# Patient Record
Sex: Male | Born: 1985 | Race: White | Hispanic: No | Marital: Single | Smoking: Former smoker
Health system: Southern US, Community
[De-identification: ages and names within clinical notes are randomized; demographics above are authoritative.]

## PROBLEM LIST (undated history)

## (undated) DIAGNOSIS — T7840XA Allergy, unspecified, initial encounter: Secondary | ICD-10-CM

## (undated) HISTORY — DX: Allergy, unspecified, initial encounter: T78.40XA

## (undated) HISTORY — PX: OTHER SURGICAL HISTORY: SHX169

---

## 2017-02-23 ENCOUNTER — Other Ambulatory Visit: Payer: Self-pay

## 2017-02-23 ENCOUNTER — Emergency Department
Admission: EM | Admit: 2017-02-23 | Discharge: 2017-02-23 | Disposition: A | Discharge status: Home / Self Care | Payer: No Typology Code available for payment source | Attending: Emergency Medicine | Admitting: Emergency Medicine

## 2017-02-23 ENCOUNTER — Emergency Department: Payer: No Typology Code available for payment source

## 2017-02-23 ENCOUNTER — Encounter: Payer: Self-pay | Admitting: *Deleted

## 2017-02-23 DIAGNOSIS — M542 Cervicalgia: Secondary | ICD-10-CM | POA: Insufficient documentation

## 2017-02-23 DIAGNOSIS — M549 Dorsalgia, unspecified: Secondary | ICD-10-CM | POA: Diagnosis not present

## 2017-02-23 DIAGNOSIS — R55 Syncope and collapse: Secondary | ICD-10-CM | POA: Diagnosis not present

## 2017-02-23 MED ORDER — MELOXICAM 15 MG PO TABS: 15.0000 mg | ORAL_TABLET | Freq: Every day | ORAL | 1 refills | Status: AC

## 2017-02-23 MED ORDER — CYCLOBENZAPRINE HCL 10 MG PO TABS: 10.0000 mg | ORAL_TABLET | Freq: Three times a day (TID) | ORAL | 0 refills | Status: AC | PRN

## 2017-02-23 NOTE — ED Notes (Signed)
Patient transported to CT 
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
## 2017-04-13 ENCOUNTER — Encounter: Payer: Self-pay | Admitting: Emergency Medicine

## 2017-04-13 ENCOUNTER — Emergency Department: Payer: BLUE CROSS/BLUE SHIELD

## 2017-04-13 ENCOUNTER — Other Ambulatory Visit: Payer: Self-pay

## 2017-04-13 ENCOUNTER — Emergency Department
Admission: EM | Admit: 2017-04-13 | Discharge: 2017-04-13 | Disposition: A | Discharge status: Home / Self Care | Payer: BLUE CROSS/BLUE SHIELD | Attending: Emergency Medicine | Admitting: Emergency Medicine

## 2017-04-13 DIAGNOSIS — S299XXA Unspecified injury of thorax, initial encounter: Secondary | ICD-10-CM | POA: Diagnosis present

## 2017-04-13 DIAGNOSIS — Y999 Unspecified external cause status: Secondary | ICD-10-CM | POA: Diagnosis not present

## 2017-04-13 DIAGNOSIS — X500XXA Overexertion from strenuous movement or load, initial encounter: Secondary | ICD-10-CM | POA: Insufficient documentation

## 2017-04-13 DIAGNOSIS — Y929 Unspecified place or not applicable: Secondary | ICD-10-CM | POA: Diagnosis not present

## 2017-04-13 DIAGNOSIS — F17290 Nicotine dependence, other tobacco product, uncomplicated: Secondary | ICD-10-CM | POA: Insufficient documentation

## 2017-04-13 DIAGNOSIS — Y939 Activity, unspecified: Secondary | ICD-10-CM | POA: Insufficient documentation

## 2017-04-13 DIAGNOSIS — S29011A Strain of muscle and tendon of front wall of thorax, initial encounter: Secondary | ICD-10-CM | POA: Insufficient documentation

## 2017-04-13 LAB — COMPREHENSIVE METABOLIC PANEL
ALBUMIN: 4.6 g/dL (ref 3.5–5.0)
ALK PHOS: 78 U/L (ref 38–126)
ALT: 45 U/L (ref 17–63)
ANION GAP: 9 (ref 5–15)
AST: 31 U/L (ref 15–41)
BILIRUBIN TOTAL: 0.8 mg/dL (ref 0.3–1.2)
BUN: 10 mg/dL (ref 6–20)
CALCIUM: 9.4 mg/dL (ref 8.9–10.3)
CO2: 23 mmol/L (ref 22–32)
Chloride: 106 mmol/L (ref 101–111)
Creatinine, Ser: 1 mg/dL (ref 0.61–1.24)
GFR calc Af Amer: >60 mL/min (ref >60)
GFR calc non Af Amer: >60 mL/min (ref >60)
GLUCOSE: 104 mg/dL — AB (ref 65–99)
Potassium: 4.2 mmol/L (ref 3.5–5.1)
Sodium: 138 mmol/L (ref 135–145)
TOTAL PROTEIN: 7.5 g/dL (ref 6.5–8.1)

## 2017-04-13 LAB — URINALYSIS, COMPLETE (UACMP) WITH MICROSCOPIC
Bacteria, UA: NONE SEEN
Bilirubin Urine: NEGATIVE
GLUCOSE, UA: NEGATIVE mg/dL
HGB URINE DIPSTICK: NEGATIVE
Ketones, ur: NEGATIVE mg/dL
Leukocytes, UA: NEGATIVE
NITRITE: NEGATIVE
PH: 6 (ref 5.0–8.0)
Protein, ur: NEGATIVE mg/dL
Specific Gravity, Urine: 1.006 (ref 1.005–1.030)
Squamous Epithelial / LPF: NONE SEEN

## 2017-04-13 LAB — CBC
HCT: 47.8 % (ref 40.0–52.0)
HEMOGLOBIN: 16.5 g/dL (ref 13.0–18.0)
MCH: 32.1 pg (ref 26.0–34.0)
MCHC: 34.4 g/dL (ref 32.0–36.0)
MCV: 93.2 fL (ref 80.0–100.0)
Platelets: 187 10*3/uL (ref 150–440)
RBC: 5.13 MIL/uL (ref 4.40–5.90)
RDW: 13.7 % (ref 11.5–14.5)
WBC: 10.6 10*3/uL (ref 3.8–10.6)

## 2017-04-13 LAB — LIPASE, BLOOD: Lipase: 25 U/L (ref 11–51)

## 2017-04-13 MED ORDER — IBUPROFEN 800 MG PO TABS
800.0000 mg | ORAL_TABLET | ORAL | Status: AC
  Administered 2017-04-13: 800 mg via ORAL
  Filled 2017-04-13: qty 1

## 2017-04-13 NOTE — ED Notes (Signed)
To x-ray and returned, c/o pain 5/10 when at rest

## 2017-04-13 NOTE — Discharge Instructions (Signed)
Return to the Emergency Department (ED) if you experience any further chest pain/pressure/tightness, difficulty breathing, or sudden sweating, or other symptoms that concern you. ° °

## 2017-04-13 NOTE — ED Provider Notes (Signed)
[facility] Emergency Department Provider Note   ____________________________________________   None    (approximate)  I have reviewed the triage vital signs and the nursing notes.   HISTORY  Chief Complaint Right lower rib pain     HPI Greg Murray is a 32 y.o. male here for evaluation of right lower chest pain.  Patient began experiencing pain in his right lower chest today.  Patient reports that he was working and he coughed.  When he coughed he had the relatively sudden and sharp pain over the right lower ribs.  No shortness of breath.  Reports when he coughs now or takes a deep breath he feels pain over the right lower ribs.  Denies abdominal pain.  No nausea vomiting.  No fevers or chills.  Somewhat unclear as the triage report reports right lower abdominal pain, the patient consistently explains to me that his pain is in the right lower chest wall with coughing and he has no pain in his abdomen.  No pain in the back.  No radiation.  No left-sided chest pain.  Pain is very intermittent, only mostly present when he takes a slight cough.  No productive cough fever or chills.  Reports he thinks he "strained a rib".  No history of any blood clots.  No long trips or travels.  Takes no medication at home.  Is a smoker.  No leg swelling.   History reviewed. No pertinent past medical history.  There are no active problems to display for this patient.   History reviewed. No pertinent surgical history.  Prior to Admission medications   Not on File    Allergies Patient has no known allergies.  History reviewed. No pertinent family history.  Social History Social History   Tobacco Use  . Smoking status: Never Smoker  . Smokeless tobacco: Current User    Types: Snuff  Substance Use Topics  . Alcohol use: Yes    Comment: occasionally  . Drug use: No    Review of Systems Constitutional: No fever/chills Eyes: No visual changes. ENT: No  sore throat. Cardiovascular: See HPI  respiratory: Denies shortness of breath. Gastrointestinal: No abdominal pain.  No nausea, no vomiting.  No diarrhea.  No constipation.  Patient reports he never had any nausea, unclear with documented triage.  He reports not feeling nauseated all. Genitourinary: Negative for dysuria. Musculoskeletal: Negative for back pain. Skin: Negative for rash. Neurological: Negative for headaches, focal weakness or numbness.    ____________________________________________   PHYSICAL EXAM:  VITAL SIGNS: ED Triage Vitals  Enc Vitals Group     BP 04/13/17 1056 (!) 145/92     Pulse Rate 04/13/17 1056 84     Resp 04/13/17 1056 20     Temp 04/13/17 1056 97.9 F (36.6 C)     Temp Source 04/13/17 1056 Oral     SpO2 04/13/17 1056 96 %     Weight 04/13/17 1057 260 lb (117.9 kg)     Height --      Head Circumference --      Peak Flow --      Pain Score 04/13/17 1057 5     Pain Loc --      Pain Edu? --      Excl. in GC? --     Constitutional: Alert and oriented. Well appearing and in no acute distress. Eyes: Conjunctivae are normal. Head: Atraumatic. Nose: No congestion/rhinnorhea. Mouth/Throat: Mucous membranes are moist. Neck: No stridor.   Cardiovascular:  Normal rate, regular rhythm. Grossly normal heart sounds.  Good peripheral circulation. Respiratory: Normal respiratory effort.  No retractions. Lungs CTAB.  Patient has reproducible pain to palpation over the right lateral chest wall, focally between approximately the 10th and 11th rib space.  Reports pain is worse with any bends to the side, takes a deep breath.  No crepitus.  No edema or overlying lesion. Gastrointestinal: Soft and nontender. No distention. Musculoskeletal: No lower extremity tenderness nor edema. Neurologic:  Normal speech and language. No gross focal neurologic deficits are appreciated.  Skin:  Skin is warm, dry and intact. No rash noted. Psychiatric: Mood and affect are normal.  Speech and behavior are normal.  ____________________________________________   LABS (all labs ordered are listed, but only abnormal results are displayed)  Labs Reviewed  COMPREHENSIVE METABOLIC PANEL - Abnormal; Notable for the following components:      Result Value   Glucose, Bld 104 (*)    All other components within normal limits  URINALYSIS, COMPLETE (UACMP) WITH MICROSCOPIC - Abnormal; Notable for the following components:   Color, Urine STRAW (*)    APPearance CLEAR (*)    All other components within normal limits  LIPASE, BLOOD  CBC   ____________________________________________  EKG   ____________________________________________  RADIOLOGY   Chest x-ray reviewed, negative for acute.  Minimal atelectasis.   ____________________________________________   PROCEDURES  Procedure(s) performed: None  Procedures  Critical Care performed: No  ____________________________________________   INITIAL IMPRESSION / ASSESSMENT AND PLAN / ED COURSE  Pertinent labs & imaging results that were available during my care of the patient were reviewed by me and considered in my medical decision making (see chart for details).  Patient returns for evaluation of chest pain.  Right lower associated suddenly with coughing.  Very reproducible and point tender along the lateral rib cage.  Appears consistent with muscular skeletal injury.  Chest x-ray negative for pneumothorax.  No associated cardiac symptoms.  Denies abdominal symptoms.  No right upper quadrant pain.  No pain to McBurney's point, negative Murphy.  Very reassuring clinical examination with stable hemodynamics.        Pulmonary Embolism Rule-out Criteria (PERC rule)                        If YES to ANY of the following, the PERC rule is not satisfied and cannot be used to rule out PE in this patient (consider d-dimer or imaging depending on pre-test probability).                      If NO to ALL of the  following, AND the clinician's pre-test probability is <15%, the [facility] rule is satisfied and there is no need for further workup (including no need to obtain a d-dimer) as the post-test probability of pulmonary embolism is <2%.                      Mnemonic is HAD CLOTS   H - hormone use (exogenous estrogen)      No. A - age > 50                                                 No. D - DVT/PE history  No.   C - coughing blood (hemoptysis)                 No. L - leg swelling, unilateral                             No. O - O2 Sat on Room Air < 95%                  No. T - tachycardia (HR ? 100)                         No. S - surgery or trauma, recent                      No.   Based on my evaluation of the patient, including application of this decision instrument, further testing to evaluate for pulmonary embolism is not indicated at this time. I have discussed this recommendation with the patient who states understanding and agreement with this plan.  Return precautions and treatment recommendations and follow-up discussed with the patient who is agreeable with the plan.  ____________________________________________   FINAL CLINICAL IMPRESSION(S) / ED DIAGNOSES  Final diagnoses:  Chest wall muscle strain, initial encounter      NEW MEDICATIONS STARTED DURING THIS VISIT:  New Prescriptions   No medications on file     Note:  This document was prepared using Dragon voice recognition software and may include unintentional dictation errors.     Sharyn Creamer, MD 04/13/17 1329

## 2017-04-13 NOTE — ED Triage Notes (Addendum)
Pt to ed with c/o right lower abd pain that started this am. +nausea, denies vomiting, denies diarrhea.  States he was coughing and felt a pop in right rib area.  Pt states pain is worse with cough, deep breath.

## 2017-05-13 ENCOUNTER — Other Ambulatory Visit: Payer: Self-pay

## 2017-05-13 ENCOUNTER — Encounter: Payer: Self-pay | Admitting: Family Medicine

## 2017-05-13 ENCOUNTER — Ambulatory Visit (INDEPENDENT_AMBULATORY_CARE_PROVIDER_SITE_OTHER): Payer: BLUE CROSS/BLUE SHIELD | Admitting: Family Medicine

## 2017-05-13 VITALS — BP 134/88 | HR 80 | Temp 98.2°F | Resp 18 | Ht 73.0 in | Wt 283.0 lb

## 2017-05-13 DIAGNOSIS — J301 Allergic rhinitis due to pollen: Secondary | ICD-10-CM

## 2017-05-13 DIAGNOSIS — Z7689 Persons encountering health services in other specified circumstances: Secondary | ICD-10-CM | POA: Diagnosis not present

## 2017-05-13 DIAGNOSIS — R490 Dysphonia: Secondary | ICD-10-CM

## 2017-05-13 DIAGNOSIS — J410 Simple chronic bronchitis: Secondary | ICD-10-CM | POA: Diagnosis not present

## 2017-05-13 MED ORDER — LORATADINE 10 MG PO CAPS: 1.0000 | ORAL_CAPSULE | Freq: Every day | ORAL | 11 refills | Status: DC

## 2017-05-13 MED ORDER — AZITHROMYCIN 250 MG PO TABS: ORAL_TABLET | ORAL | 0 refills | Status: DC

## 2017-05-13 MED ORDER — FLUTICASONE PROPIONATE 50 MCG/ACT NA SUSP: 2.0000 | Freq: Every day | NASAL | 11 refills | Status: DC

## 2017-05-13 NOTE — Progress Notes (Signed)
Greg Murray  MRN: [id number] DOB: 1985/03/07  Subjective:  HPI   The patient is a 32 year old male who presents to establish care and get treated for his sore throat.   The patient states that 2 months ago he started having head congestion, PND, cough and chest congestion.  His cough was productive of white phlegm.  He was using Mucinex DM and was seen by [facility] at the end of January.  He was treated with Amoxicillin and started to improve.   About a week after he finished the antibiotic he started having some low grade fever.  He was then seen at [facility] and told he had the flu and was given a pill for cough but told he could not treat him for the flu because his fever was not high enough. After a while his fever subsided but he is sstill left with cough, congestion in his throat and voice change.  He state the cone symptoms are a little better on the weekend but when he goes back to work it gets worse again.  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   There are no active problems to display for this patient.   Past Medical History:  Diagnosis Date  . Allergy     Social History   Socioeconomic History  . Marital status: Single    Spouse name: Not on file  . Number of children: Not on file  . Years of education: Not on file  . Highest education level: Not on file  Occupational History  . Occupation: heavy Clinical cytogeneticist  Social Needs  . Financial resource strain: Not on file  . Food insecurity:    Worry: Not on file    Inability: Not on file  . Transportation needs:    Medical: Not on file    Non-medical: Not on file  Tobacco Use  . Smoking status: Current Every Day Smoker    Types: Cigarettes  . Smokeless tobacco: Current User    Types: Snuff  Substance and Sexual Activity  . Alcohol use: Yes    Alcohol/week: 14.4 oz    Types: 24 Cans of beer per week    Comment: 24 cans per week  . Drug use: No  . Sexual  activity: Not on file  Lifestyle  . Physical activity:    Days per week: Not on file    Minutes per session: Not on file  . Stress: Not on file  Relationships  . Social connections:    Talks on phone: Not on file    Gets together: Not on file    Attends religious service: Not on file    Active member of club or organization: Not on file    Attends meetings of clubs or organizations: Not on file    Relationship status: Not on file  . Intimate partner violence:    Fear of current or ex partner: Not on file    Emotionally abused: Not on file    Physically abused: Not on file    Forced sexual activity: Not on file  Other Topics Concern  . Not on file  Social History Narrative  . Not on file    No outpatient encounter medications on file as of 05/13/2017.   No facility-administered encounter medications on file as of 05/13/2017.     No Known Allergies  Review of Systems  Constitutional: Negative.  HENT: Positive for congestion, sinus pain and sore throat.        Voice change and runny nose  Eyes: Negative.   Respiratory: Positive for cough and wheezing.   Cardiovascular: Negative.   Gastrointestinal: Negative.   Skin: Negative.   Endo/Heme/Allergies: Negative.   Psychiatric/Behavioral: Negative.     Objective:  BP 134/88 (BP Location: Right Arm, Patient Position: Sitting, Cuff Size: Large)   Pulse 80   Temp 98.2 F (36.8 C) (Oral)   Resp 18   Ht 6\' 1"  (1.854 m)   Wt 283 lb (128.4 kg)   SpO2 95%   BMI 37.34 kg/m   Physical Exam  Constitutional: He is oriented to person, place, and time and well-developed, well-nourished, and in no distress.  HENT:  Head: Normocephalic and atraumatic.  Right Ear: External ear normal.  Left Ear: External ear normal.  Nose: Nose normal.  Mouth/Throat: Oropharynx is clear and moist. No oropharyngeal exudate.  Eyes: Conjunctivae are normal. No scleral icterus.  Neck: No thyromegaly present.  Cardiovascular: Normal rate, regular  rhythm and normal heart sounds.  Pulmonary/Chest: Effort normal and breath sounds normal.  Abdominal: Soft.  Lymphadenopathy:    He has no cervical adenopathy.  Neurological: He is alert and oriented to person, place, and time. Gait normal. GCS score is 15.  Skin: Skin is warm and dry.  Psychiatric: Mood, memory, affect and judgment normal.    Assessment and Plan :  1. Encounter to establish care   2. Simple chronic bronchitis (HCC)  - azithromycin (ZITHROMAX) 250 MG tablet; Take 2 tablet on the first day then one daily until finished.  Dispense: 6 tablet; Refill: 0  3. Allergic rhinitis due to pollen, unspecified seasonality  - Loratadine 10 MG CAPS; Take 1 capsule (10 mg total) by mouth daily.  Dispense: 30 each; Refill: 11 - fluticasone (FLONASE) 50 MCG/ACT nasal spray; Place 2 sprays into both nostrils daily.  Dispense: 16 g; Refill: 11 4.hoarseness Will further evaluate if not improving in a couple of weeks. 5.Tobacco Abuse Pt encouraged to quit.  I have done the exam and reviewed the chart and it is accurate to the best of my knowledge. DentistDragon  technology has been used and  any errors in dictation or transcription are unintentional. Julieanne Mansonichard Neliah Cuyler M.D. [facility]

## 2017-06-04 ENCOUNTER — Encounter: Payer: BLUE CROSS/BLUE SHIELD | Admitting: Family Medicine

## 2017-09-21 ENCOUNTER — Emergency Department: Payer: BLUE CROSS/BLUE SHIELD

## 2017-09-21 ENCOUNTER — Emergency Department
Admission: EM | Admit: 2017-09-21 | Discharge: 2017-09-21 | Disposition: A | Discharge status: Home / Self Care | Payer: BLUE CROSS/BLUE SHIELD | Attending: Emergency Medicine | Admitting: Emergency Medicine

## 2017-09-21 ENCOUNTER — Other Ambulatory Visit: Payer: Self-pay

## 2017-09-21 ENCOUNTER — Encounter: Payer: Self-pay | Admitting: *Deleted

## 2017-09-21 DIAGNOSIS — F17210 Nicotine dependence, cigarettes, uncomplicated: Secondary | ICD-10-CM | POA: Insufficient documentation

## 2017-09-21 DIAGNOSIS — F10929 Alcohol use, unspecified with intoxication, unspecified: Secondary | ICD-10-CM

## 2017-09-21 DIAGNOSIS — R51 Headache: Secondary | ICD-10-CM | POA: Diagnosis not present

## 2017-09-21 DIAGNOSIS — Y999 Unspecified external cause status: Secondary | ICD-10-CM | POA: Insufficient documentation

## 2017-09-21 DIAGNOSIS — Y92410 Unspecified street and highway as the place of occurrence of the external cause: Secondary | ICD-10-CM | POA: Diagnosis not present

## 2017-09-21 DIAGNOSIS — Y9389 Activity, other specified: Secondary | ICD-10-CM | POA: Diagnosis not present

## 2017-09-21 DIAGNOSIS — Z79899 Other long term (current) drug therapy: Secondary | ICD-10-CM | POA: Diagnosis not present

## 2017-09-21 DIAGNOSIS — S81811A Laceration without foreign body, right lower leg, initial encounter: Secondary | ICD-10-CM | POA: Insufficient documentation

## 2017-09-21 DIAGNOSIS — F10229 Alcohol dependence with intoxication, unspecified: Secondary | ICD-10-CM | POA: Insufficient documentation

## 2017-09-21 DIAGNOSIS — S0990XA Unspecified injury of head, initial encounter: Secondary | ICD-10-CM | POA: Diagnosis present

## 2017-09-21 DIAGNOSIS — Z23 Encounter for immunization: Secondary | ICD-10-CM | POA: Diagnosis not present

## 2017-09-21 DIAGNOSIS — R079 Chest pain, unspecified: Secondary | ICD-10-CM | POA: Insufficient documentation

## 2017-09-21 LAB — COMPREHENSIVE METABOLIC PANEL
ALBUMIN: 4.3 g/dL (ref 3.5–5.0)
ALT: 55 U/L — AB (ref 0–44)
AST: 48 U/L — AB (ref 15–41)
Alkaline Phosphatase: 81 U/L (ref 38–126)
Anion gap: 11 (ref 5–15)
BILIRUBIN TOTAL: 0.8 mg/dL (ref 0.3–1.2)
BUN: 10 mg/dL (ref 6–20)
CHLORIDE: 109 mmol/L (ref 98–111)
CO2: 22 mmol/L (ref 22–32)
CREATININE: 0.94 mg/dL (ref 0.61–1.24)
Calcium: 9.1 mg/dL (ref 8.9–10.3)
GFR calc Af Amer: >60 mL/min (ref >60)
GFR calc non Af Amer: >60 mL/min (ref >60)
GLUCOSE: 118 mg/dL — AB (ref 70–99)
POTASSIUM: 3.8 mmol/L (ref 3.5–5.1)
Sodium: 142 mmol/L (ref 135–145)
Total Protein: 7.1 g/dL (ref 6.5–8.1)

## 2017-09-21 LAB — CBC WITH DIFFERENTIAL/PLATELET
Basophils Absolute: 0.1 10*3/uL (ref 0–0.1)
Basophils Relative: 1 %
EOS PCT: 1 %
Eosinophils Absolute: 0.1 10*3/uL (ref 0–0.7)
HCT: 46.9 % (ref 40.0–52.0)
Hemoglobin: 17 g/dL (ref 13.0–18.0)
LYMPHS ABS: 2.3 10*3/uL (ref 1.0–3.6)
LYMPHS PCT: 18 %
MCH: 34.7 pg — AB (ref 26.0–34.0)
MCHC: 36.2 g/dL — AB (ref 32.0–36.0)
MCV: 95.9 fL (ref 80.0–100.0)
MONO ABS: 0.9 10*3/uL (ref 0.2–1.0)
MONOS PCT: 7 %
Neutro Abs: 9.3 10*3/uL — ABNORMAL HIGH (ref 1.4–6.5)
Neutrophils Relative %: 73 %
PLATELETS: 197 10*3/uL (ref 150–440)
RBC: 4.89 MIL/uL (ref 4.40–5.90)
RDW: 13.3 % (ref 11.5–14.5)
WBC: 12.6 10*3/uL — ABNORMAL HIGH (ref 3.8–10.6)

## 2017-09-21 MED ORDER — LIDOCAINE-EPINEPHRINE 1 %-1:100000 IJ SOLN
10.0000 mL | Freq: Once | INTRAMUSCULAR | Status: AC
  Administered 2017-09-21: 10 mL via INTRADERMAL
  Filled 2017-09-21 (×3): qty 10

## 2017-09-21 MED ORDER — HYDROCODONE-ACETAMINOPHEN 5-325 MG PO TABS: 1.0000 | ORAL_TABLET | Freq: Four times a day (QID) | ORAL | 0 refills | Status: DC | PRN

## 2017-09-21 MED ORDER — TETANUS-DIPHTH-ACELL PERTUSSIS 5-2.5-18.5 LF-MCG/0.5 IM SUSP: 0.5000 mL | Freq: Once | INTRAMUSCULAR | Status: DC

## 2017-09-21 MED ORDER — TETANUS-DIPHTH-ACELL PERTUSSIS 5-2.5-18.5 LF-MCG/0.5 IM SUSP
0.5000 mL | Freq: Once | INTRAMUSCULAR | Status: AC
  Administered 2017-09-21: 0.5 mL via INTRAMUSCULAR
  Filled 2017-09-21: qty 0.5

## 2017-09-21 MED ORDER — IBUPROFEN 600 MG PO TABS: 600.0000 mg | ORAL_TABLET | Freq: Three times a day (TID) | ORAL | 0 refills | Status: AC | PRN

## 2017-09-21 MED ORDER — IOPAMIDOL (ISOVUE-300) INJECTION 61%
100.0000 mL | Freq: Once | INTRAVENOUS | Status: AC | PRN
  Administered 2017-09-21: 100 mL via INTRAVENOUS
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
## 2017-09-21 NOTE — ED Notes (Signed)
Laceration to right shin and area cleansed
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
## 2018-07-15 ENCOUNTER — Encounter: Payer: Self-pay | Admitting: Emergency Medicine

## 2018-07-15 ENCOUNTER — Other Ambulatory Visit: Payer: Self-pay

## 2018-07-15 DIAGNOSIS — F410 Panic disorder [episodic paroxysmal anxiety] without agoraphobia: Secondary | ICD-10-CM | POA: Diagnosis not present

## 2018-07-15 DIAGNOSIS — F17210 Nicotine dependence, cigarettes, uncomplicated: Secondary | ICD-10-CM | POA: Diagnosis not present

## 2018-07-15 DIAGNOSIS — Z79899 Other long term (current) drug therapy: Secondary | ICD-10-CM | POA: Insufficient documentation

## 2018-07-15 DIAGNOSIS — R42 Dizziness and giddiness: Secondary | ICD-10-CM | POA: Diagnosis present

## 2018-07-15 DIAGNOSIS — F17220 Nicotine dependence, chewing tobacco, uncomplicated: Secondary | ICD-10-CM | POA: Insufficient documentation

## 2018-07-15 LAB — URINALYSIS, COMPLETE (UACMP) WITH MICROSCOPIC
Bacteria, UA: NONE SEEN
Bilirubin Urine: NEGATIVE
Glucose, UA: NEGATIVE mg/dL
Hgb urine dipstick: NEGATIVE
Ketones, ur: NEGATIVE mg/dL
Leukocytes,Ua: NEGATIVE
Nitrite: NEGATIVE
Protein, ur: NEGATIVE mg/dL
Specific Gravity, Urine: 1.002 — ABNORMAL LOW (ref 1.005–1.030)
Squamous Epithelial / LPF: NONE SEEN (ref 0–5)
pH: 6 (ref 5.0–8.0)

## 2018-07-15 LAB — COMPREHENSIVE METABOLIC PANEL
ALT: 36 U/L (ref 0–44)
AST: 23 U/L (ref 15–41)
Albumin: 4.5 g/dL (ref 3.5–5.0)
Alkaline Phosphatase: 72 U/L (ref 38–126)
Anion gap: 11 (ref 5–15)
BUN: 12 mg/dL (ref 6–20)
CO2: 23 mmol/L (ref 22–32)
Calcium: 9.6 mg/dL (ref 8.9–10.3)
Chloride: 104 mmol/L (ref 98–111)
Creatinine, Ser: 1.04 mg/dL (ref 0.61–1.24)
GFR calc Af Amer: >60 mL/min (ref >60)
GFR calc non Af Amer: >60 mL/min (ref >60)
Glucose, Bld: 122 mg/dL — ABNORMAL HIGH (ref 70–99)
Potassium: 3.7 mmol/L (ref 3.5–5.1)
Sodium: 138 mmol/L (ref 135–145)
Total Bilirubin: 1 mg/dL (ref 0.3–1.2)
Total Protein: 7.4 g/dL (ref 6.5–8.1)

## 2018-07-15 LAB — CBC WITH DIFFERENTIAL/PLATELET
Abs Immature Granulocytes: 0.11 10*3/uL — ABNORMAL HIGH (ref 0.00–0.07)
Basophils Absolute: 0.1 10*3/uL (ref 0.0–0.1)
Basophils Relative: 1 %
Eosinophils Absolute: 0.1 10*3/uL (ref 0.0–0.5)
Eosinophils Relative: 1 %
HCT: 48.3 % (ref 39.0–52.0)
Hemoglobin: 16.8 g/dL (ref 13.0–17.0)
Immature Granulocytes: 1 %
Lymphocytes Relative: 18 %
Lymphs Abs: 1.9 10*3/uL (ref 0.7–4.0)
MCH: 33.2 pg (ref 26.0–34.0)
MCHC: 34.8 g/dL (ref 30.0–36.0)
MCV: 95.5 fL (ref 80.0–100.0)
Monocytes Absolute: 0.7 10*3/uL (ref 0.1–1.0)
Monocytes Relative: 7 %
Neutro Abs: 7.9 10*3/uL — ABNORMAL HIGH (ref 1.7–7.7)
Neutrophils Relative %: 72 %
Platelets: 170 10*3/uL (ref 150–400)
RBC: 5.06 MIL/uL (ref 4.22–5.81)
RDW: 12.6 % (ref 11.5–15.5)
WBC: 10.8 10*3/uL — ABNORMAL HIGH (ref 4.0–10.5)
nRBC: 0 % (ref 0.0–0.2)

## 2018-07-15 LAB — TROPONIN I: Troponin I: <0.03 ng/mL (ref <0.03)

## 2018-07-15 LAB — LIPASE, BLOOD: Lipase: 24 U/L (ref 11–51)

## 2018-07-15 NOTE — ED Triage Notes (Addendum)
Patient ambulatory to triage with steady gait, without difficulty or distress noted, mask in place; pt reports dizziness, intermittently x 2wks accomp by nausea; denies any recent illness; also has had pain to rt side radiating to general abd

## 2018-07-16 ENCOUNTER — Emergency Department
Admission: EM | Admit: 2018-07-16 | Discharge: 2018-07-16 | Disposition: A | Discharge status: Home / Self Care | Payer: BC Managed Care – PPO | Attending: Emergency Medicine | Admitting: Emergency Medicine

## 2018-07-16 DIAGNOSIS — R42 Dizziness and giddiness: Secondary | ICD-10-CM

## 2018-07-16 DIAGNOSIS — F410 Panic disorder [episodic paroxysmal anxiety] without agoraphobia: Secondary | ICD-10-CM

## 2018-07-16 MED ORDER — MECLIZINE HCL 25 MG PO TABS: 25.0000 mg | ORAL_TABLET | Freq: Three times a day (TID) | ORAL | 0 refills | Status: AC | PRN

## 2018-07-16 NOTE — Discharge Instructions (Signed)
You have been seen in the Emergency Department (ED) today for dizziness and some tingling in your hands and legs as well as some shortness of breath..  As we have discussed todays test results are normal, and we feel it is likely that panic attacks may be causing your symptoms.  Please follow up with the recommended doctor as instructed above in these documents regarding todays emergent visit and your recent symptoms to discuss further management.    Return to the Emergency Department (ED) if you experience any further chest pain/pressure/tightness, difficulty breathing, or sudden sweating, or other symptoms that concern you.
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
## 2018-07-16 NOTE — ED Notes (Signed)
Signature pad in pts room not working pts gives verbal understanding of discharge instructions.
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
## 2020-02-23 ENCOUNTER — Other Ambulatory Visit: Payer: Self-pay

## 2020-02-23 ENCOUNTER — Ambulatory Visit
Admission: EM | Admit: 2020-02-23 | Discharge: 2020-02-23 | Disposition: A | Discharge status: Home / Self Care | Payer: BC Managed Care – PPO

## 2020-02-23 DIAGNOSIS — J0110 Acute frontal sinusitis, unspecified: Secondary | ICD-10-CM

## 2020-02-23 DIAGNOSIS — J301 Allergic rhinitis due to pollen: Secondary | ICD-10-CM

## 2020-02-23 MED ORDER — FLUTICASONE PROPIONATE 50 MCG/ACT NA SUSP
2.0000 | Freq: Every day | NASAL | 11 refills | Status: AC
Start: 2020-02-23 — End: ?

## 2020-02-23 MED ORDER — AMOXICILLIN-POT CLAVULANATE 875-125 MG PO TABS: 1.0000 | ORAL_TABLET | Freq: Two times a day (BID) | ORAL | 0 refills | Status: AC

## 2020-02-23 NOTE — ED Provider Notes (Signed)
[name]    CSN: [id number] Arrival date & time: 02/23/20  1437      History   Chief Complaint Chief Complaint  Patient presents with  . Facial Pain    Sinus    HPI Greg Murray is a 35 y.o. male.   Patient is a 35 year old male who presents today with sinus pressure, sinus headache, bilateral ear pressure, teeth pain with mucus production.  Symptoms have been constant and worsening.  Low-grade fever.  Over-the-counter medicines without any relief.  Take it at home COVID test which was negative.     Past Medical History:  Diagnosis Date  . Allergy     There are no problems to display for this patient.   Past Surgical History:  Procedure Laterality Date  . none         Home Medications    Prior to Admission medications   Medication Sig Start Date End Date Taking? Authorizing Provider  albuterol (VENTOLIN HFA) 108 (90 Base) MCG/ACT inhaler Inhale into the lungs every 6 (six) hours as needed for wheezing or shortness of breath.   Yes [provider]  ALPRAZolam Prudy Feeler) 0.5 MG tablet Take 0.5 mg by mouth at bedtime as needed for anxiety.   Yes [provider]  amoxicillin-clavulanate (AUGMENTIN) 875-125 MG tablet Take 1 tablet by mouth every 12 (twelve) hours. 02/23/20  Yes Corvin Sorbo A, NP  ibuprofen (ADVIL,MOTRIN) 600 MG tablet Take 1 tablet (600 mg total) by mouth every 8 (eight) hours as needed. 09/21/17  Yes Merrily Brittle, MD  meclizine (ANTIVERT) 25 MG tablet Take 1 tablet (25 mg total) by mouth 3 (three) times daily as needed for dizziness. 07/16/18  Yes Loleta Rose, MD  fluticasone (FLONASE) 50 MCG/ACT nasal spray Place 2 sprays into both nostrils daily. 02/23/20   Dahlia Byes A, NP  Loratadine 10 MG CAPS Take 1 capsule (10 mg total) by mouth daily. 05/13/17 02/23/20  Maple Hudson., MD    Family History Family History  Problem Relation Age of Onset  . Heart disease Mother        MI 2016  . Diabetes Father      Social History Social History   Tobacco Use  . Smoking status: Former Smoker    Types: Cigarettes  . Smokeless tobacco: Current User    Types: Snuff  Vaping Use  . Vaping Use: Every day  . Substances: Nicotine, Flavoring  Substance Use Topics  . Alcohol use: Yes    Alcohol/week: 6.0 - 12.0 standard drinks    Types: 6 - 12 Cans of beer per week    Comment: 6-12 cans per day  . Drug use: No     Allergies   Patient has no known allergies.   Review of Systems Review of Systems   Physical Exam Triage Vital Signs ED Triage Vitals  Enc Vitals Group     BP 02/23/20 1446 (!) 170/109     Pulse Rate 02/23/20 1446 92     Resp 02/23/20 1446 18     Temp 02/23/20 1446 98.1 F (36.7 C)     Temp Source 02/23/20 1446 Oral     SpO2 02/23/20 1446 96 %     Weight --      Height --      Head Circumference --      Peak Flow --      Pain Score 02/23/20 1501 5     Pain Loc --  Pain Edu? --      Excl. in [place]? --    No data found.  Updated Vital Signs BP (!) 170/109 (BP Location: Left Arm)   Pulse 92   Temp 98.1 F (36.7 C) (Oral)   Resp 18   SpO2 96%   Visual Acuity Right Eye Distance:   Left Eye Distance:   Bilateral Distance:    Right Eye Near:   Left Eye Near:    Bilateral Near:     Physical Exam Vitals and nursing note reviewed.  Constitutional:      General: He is not in acute distress.    Appearance: Normal appearance. He is not ill-appearing, toxic-appearing or diaphoretic.  HENT:     Head: Normocephalic and atraumatic.     Right Ear: Tympanic membrane and ear canal normal.     Left Ear: Tympanic membrane and ear canal normal.     Nose: Congestion present.     Mouth/Throat:     Pharynx: Oropharynx is clear.  Eyes:     Conjunctiva/sclera: Conjunctivae normal.  Cardiovascular:     Rate and Rhythm: Normal rate and regular rhythm.  Pulmonary:     Effort: Pulmonary effort is normal.     Breath sounds: Normal breath sounds.  Musculoskeletal:         General: Normal range of motion.     Cervical back: Normal range of motion.  Skin:    General: Skin is warm and dry.  Neurological:     Mental Status: He is alert.  Psychiatric:        Mood and Affect: Mood normal.      UC Treatments / Results  Labs (all labs ordered are listed, but only abnormal results are displayed) Labs Reviewed - No data to display  EKG   Radiology No results found.  Procedures Procedures (including critical care time)  Medications Ordered in UC Medications - No data to display  Initial Impression / Assessment and Plan / UC Course  I have reviewed the triage vital signs and the nursing notes.  Pertinent labs & imaging results that were available during my care of the patient were reviewed by me and considered in my medical decision making (see chart for details).     Treating for sinus infection.  Medication as prescribed. OTC meds as needed Follow up as needed for continued or worsening symptoms  Final Clinical Impressions(s) / UC Diagnoses   Final diagnoses:  Acute non-recurrent frontal sinusitis     Discharge Instructions     Treating you for a sinus infection.  Take the medications as prescribed.  Over-the-counter medicines as needed.Follow up as needed for continued or worsening symptoms     ED Prescriptions    Medication Sig Dispense Auth. Provider   amoxicillin-clavulanate (AUGMENTIN) 875-125 MG tablet Take 1 tablet by mouth every 12 (twelve) hours. 14 tablet Rozell Kettlewell A, NP   fluticasone (FLONASE) 50 MCG/ACT nasal spray Place 2 sprays into both nostrils daily. 16 g Dahlia Byes A, NP     PDMP not reviewed this encounter.   Janace Aris, NP 02/23/20 1542

## 2020-02-23 NOTE — ED Triage Notes (Signed)
Sinus pressure, ear pain, tooth pain x 3 days.  At home test was negative.  Low grade temp.  Refusing COVID test.

## 2020-02-23 NOTE — Discharge Instructions (Signed)
Treating you for a sinus infection.  Take the medications as prescribed.  Over-the-counter medicines as needed.Follow up as needed for continued or worsening symptoms

## 2021-02-21 ENCOUNTER — Other Ambulatory Visit: Payer: Self-pay | Admitting: Physician Assistant

## 2021-02-21 ENCOUNTER — Ambulatory Visit
Admission: RE | Admit: 2021-02-21 | Discharge: 2021-02-21 | Disposition: A | Discharge status: Home / Self Care | Payer: BC Managed Care – PPO | Source: Ambulatory Visit | Attending: Physician Assistant | Admitting: Physician Assistant

## 2021-02-21 DIAGNOSIS — Z0000 Encounter for general adult medical examination without abnormal findings: Secondary | ICD-10-CM

## 2021-02-21 DIAGNOSIS — Z049 Encounter for examination and observation for unspecified reason: Secondary | ICD-10-CM | POA: Insufficient documentation

## 2021-02-21 DIAGNOSIS — Z575 Occupational exposure to toxic agents in other industries: Secondary | ICD-10-CM | POA: Insufficient documentation
